# Patient Record
Sex: Male | Born: 1995 | Race: White | Hispanic: No | Marital: Married | Smoking: Current every day smoker
Health system: Southern US, Community
[De-identification: ages and names within clinical notes are randomized; demographics above are authoritative.]

## PROBLEM LIST (undated history)

## (undated) HISTORY — PX: TESTICLE SURGERY: SHX794

---
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
## 2016-06-25 ENCOUNTER — Ambulatory Visit (INDEPENDENT_AMBULATORY_CARE_PROVIDER_SITE_OTHER): Payer: 59 | Admitting: Family Medicine

## 2016-06-25 ENCOUNTER — Encounter: Payer: Self-pay | Admitting: Family Medicine

## 2016-06-25 VITALS — BP 110/70 | HR 64 | Temp 97.7°F | Wt 126.2 lb

## 2016-06-25 DIAGNOSIS — H65192 Other acute nonsuppurative otitis media, left ear: Secondary | ICD-10-CM

## 2016-06-25 MED ORDER — AMOXICILLIN 875 MG PO TABS: 875.0000 mg | ORAL_TABLET | Freq: Two times a day (BID) | ORAL | 0 refills | Status: DC

## 2016-06-25 NOTE — Patient Instructions (Signed)
You do have left ear infection - treat with amoxicillin twice daily for 7 days. May take ibuprofen 400-600mg  twice daily with meals for next 3 days. Let us know if not improving with treatment.    Otitis Media, Adult Otitis media is redness, soreness, and inflammation of the middle ear. Otitis media may be caused by allergies or, most commonly, by infection. Often it occurs as a complication of the common cold. SIGNS AND SYMPTOMS Symptoms of otitis media may include:  Earache.  Fever.  Ringing in your ear.  Headache.  Leakage of fluid from the ear. DIAGNOSIS To diagnose otitis media, your health care provider will examine your ear with an otoscope. This is an instrument that allows your health care provider to see into your ear in order to examine your eardrum. Your health care provider also will ask you questions about your symptoms. TREATMENT  Typically, otitis media resolves on its own within 3-5 days. Your health care provider may prescribe medicine to ease your symptoms of pain. If otitis media does not resolve within 5 days or is recurrent, your health care provider may prescribe antibiotic medicines if he or she suspects that a bacterial infection is the cause. HOME CARE INSTRUCTIONS   If you were prescribed an antibiotic medicine, finish it all even if you start to feel better.  Take medicines only as directed by your health care provider.  Keep all follow-up visits as directed by your health care provider. SEEK MEDICAL CARE IF:  You have otitis media only in one ear, or bleeding from your nose, or both.  You notice a lump on your neck.  You are not getting better in 3-5 days.  You feel worse instead of better. SEEK IMMEDIATE MEDICAL CARE IF:   You have pain that is not controlled with medicine.  You have swelling, redness, or pain around your ear or stiffness in your neck.  You notice that part of your face is paralyzed.  You notice that the bone behind your ear  (mastoid) is tender when you touch it. MAKE SURE YOU:   Understand these instructions.  Will watch your condition.  Will get help right away if you are not doing well or get worse.   This information is not intended to replace advice given to you by your health care provider. Make sure you discuss any questions you have with your health care provider.   Document Released: 07/18/2004 Document Revised: 11/03/2014 Document Reviewed: 05/10/2013 Elsevier Interactive Patient Education [employer]2016 Elsevier Inc.

## 2016-06-25 NOTE — Assessment & Plan Note (Signed)
Treat with 7d amox course. Reviewed ibuprofen dosing. Supportive care discussed. See pt instructions.

## 2016-06-25 NOTE — Progress Notes (Signed)
   BP 110/70   Pulse 64   Temp 97.7 F (36.5 C) (Oral)   Wt 126 lb 4 oz (57.3 kg)    CC: L earache Subjective:    Patient ID: Carlos Blake, male    DOB: [birth year], 20 y.o.   MRN: [id number]  HPI: Carlos Blake is a [age] y.o. male presenting on 06/25/2016 for Otitis Media (left)   1 wk h/o L earache. + cough and rhinorrhea present as well. Last night L headache due to ear pain. Ibuprofen did help some.  No fevers/chills, drainage, hearing loss, tinnitus.  No h/o AOM.  Grandparents sick recently.  Parents smoke.   Recent trip to [place], did swim [place].   Relevant past medical, surgical, family and social history reviewed and updated as indicated. Interim medical history since our last visit reviewed. Allergies and medications reviewed and updated. No current outpatient prescriptions on file prior to visit.   No current facility-administered medications on file prior to visit.     Review of Systems Per HPI unless specifically indicated in ROS section     Objective:    BP 110/70   Pulse 64   Temp 97.7 F (36.5 C) (Oral)   Wt 126 lb 4 oz (57.3 kg)   Wt Readings from Last 3 Encounters:  06/25/16 126 lb 4 oz (57.3 kg) (10 %, Z= -1.29)*  07/04/14 119 lb 4 oz (54.1 kg) (12 %, Z= -1.15)*  06/28/13 116 lb 8 oz (52.8 kg) (20 %, Z= -0.85)*   * Growth percentiles are based on CDC 2-20 Years data.    Physical Exam  Constitutional: He appears well-developed and well-nourished. No distress.  HENT:  Head: Normocephalic and atraumatic.  Right Ear: Hearing, tympanic membrane, external ear and ear canal normal.  Left Ear: Hearing, external ear and ear canal normal.  Nose: Nose normal. No mucosal edema or rhinorrhea. Right sinus exhibits no maxillary sinus tenderness and no frontal sinus tenderness. Left sinus exhibits no maxillary sinus tenderness and no frontal sinus tenderness.  Mouth/Throat: Uvula is midline, oropharynx is clear and moist and mucous membranes are normal. No  oropharyngeal exudate, posterior oropharyngeal edema, posterior oropharyngeal erythema or tonsillar abscesses.  Mild erythema with bulging of L TM, canal clear  Eyes: Conjunctivae and EOM are normal. Pupils are equal, round, and reactive to light. No scleral icterus.  Neck: Normal range of motion. Neck supple.  Cardiovascular: Normal rate, regular rhythm, normal heart sounds and intact distal pulses.   No murmur heard. Pulmonary/Chest: Effort normal and breath sounds normal. No respiratory distress. He has no wheezes. He has no rales.  Lymphadenopathy:    He has no cervical adenopathy.  Skin: Skin is warm and dry. No rash noted.  Nursing note and vitals reviewed.     Assessment & Plan:   Problem List Items Addressed This Visit    Acute nonsuppurative otitis media of left ear - Primary    Treat with 7d amox course. Reviewed ibuprofen dosing. Supportive care discussed. See pt instructions.      Relevant Medications   amoxicillin (AMOXIL) 875 MG tablet    Other Visit Diagnoses   None.      Follow up plan: No Follow-up on file.  Eustaquio BoydenJavier Tysin Salada, MD

## 2016-06-25 NOTE — Progress Notes (Signed)
Pre visit review using our clinic review tool, if applicable. No additional management support is needed unless otherwise documented below in the visit note. 

## 2017-11-24 ENCOUNTER — Ambulatory Visit: Payer: BLUE CROSS/BLUE SHIELD | Admitting: Family Medicine

## 2017-11-24 ENCOUNTER — Encounter: Payer: Self-pay | Admitting: Family Medicine

## 2017-11-24 VITALS — BP 118/68 | HR 110 | Temp 98.3°F | Wt 129.8 lb

## 2017-11-24 DIAGNOSIS — J111 Influenza due to unidentified influenza virus with other respiratory manifestations: Secondary | ICD-10-CM

## 2017-11-24 NOTE — Patient Instructions (Addendum)
I do think you have flu. Push fluids, rest, continue ibuprofen 800mg  with food for pain and inflammation.  Out of work until fever is gone. Symptoms should improve over next 5-6 days.   Influenza, Adult Influenza, more commonly known as "the flu," is a viral infection that primarily affects the respiratory tract. The respiratory tract includes organs that help you breathe, such as the lungs, nose, and throat. The flu causes many common cold symptoms, as well as a high fever and body aches. The flu spreads easily from person to person (is contagious). Getting a flu shot (influenza vaccination) every year is the best way to prevent influenza. What are the causes? Influenza is caused by a virus. You can catch the virus by:  Breathing in droplets from an infected person's cough or sneeze.  Touching something that was recently contaminated with the virus and then touching your mouth, nose, or eyes.  What increases the risk? The following factors may make you more likely to get the flu:  Not cleaning your hands frequently with soap and water or alcohol-based hand sanitizer.  Having close contact with many people during cold and flu season.  Touching your mouth, eyes, or nose without washing or sanitizing your hands first.  Not drinking enough fluids or not eating a healthy diet.  Not getting enough sleep or exercise.  Being under a high amount of stress.  Not getting a yearly (annual) flu shot.  You may be at a higher risk of complications from the flu, such as a severe lung infection (pneumonia), if you:  Are over the age of 21.  Are pregnant.  Have a weakened disease-fighting system (immune system). You may have a weakened immune system if you: ? Have HIV or AIDS. ? Are undergoing chemotherapy. ? Aretaking medicines that reduce the activity of (suppress) the immune system.  Have a long-term (chronic) illness, such as heart disease, kidney disease, diabetes, or lung  disease.  Have a liver disorder.  Are obese.  Have anemia.  What are the signs or symptoms? Symptoms of this condition typically last 4-10 days and may include:  Fever.  Chills.  Headache, body aches, or muscle aches.  Sore throat.  Cough.  Runny or congested nose.  Chest discomfort and cough.  Poor appetite.  Weakness or tiredness (fatigue).  Dizziness.  Nausea or vomiting.  How is this diagnosed? This condition may be diagnosed based on your medical history and a physical exam. Your health care provider may do a nose or throat swab test to confirm the diagnosis. How is this treated? If influenza is detected early, you can be treated with antiviral medicine that can reduce the length of your illness and the severity of your symptoms. This medicine may be given by mouth (orally) or through an IV tube that is inserted in one of your veins. The goal of treatment is to relieve symptoms by taking care of yourself at home. This may include taking over-the-counter medicines, drinking plenty of fluids, and adding humidity to the air in your home. In some cases, influenza goes away on its own. Severe influenza or complications from influenza may be treated in a hospital. Follow these instructions at home:  Take over-the-counter and prescription medicines only as told by your health care provider.  Use a cool mist humidifier to add humidity to the air in your home. This can make breathing easier.  Rest as needed.  Drink enough fluid to keep your urine clear or pale yellow.  Cover your mouth and nose when you cough or sneeze.  Wash your hands with soap and water often, especially after you cough or sneeze. If soap and water are not available, use hand sanitizer.  Stay home from work or school as told by your health care provider. Unless you are visiting your health care provider, try to avoid leaving home until your fever has been gone for 24 hours without the use of  medicine.  Keep all follow-up visits as told by your health care provider. This is important. How is this prevented?  Getting an annual flu shot is the best way to avoid getting the flu. You may get the flu shot in late summer, fall, or winter. Ask your health care provider when you should get your flu shot.  Wash your hands often or use hand sanitizer often.  Avoid contact with people who are sick during cold and flu season.  Eat a healthy diet, drink plenty of fluids, get enough sleep, and exercise regularly. Contact a health care provider if:  You develop new symptoms.  You have: ? Chest pain. ? Diarrhea. ? A fever.  Your cough gets worse.  You produce more mucus.  You feel nauseous or you vomit. Get help right away if:  You develop shortness of breath or difficulty breathing.  Your skin or nails turn a bluish color.  You have severe pain or stiffness in your neck.  You develop a sudden headache or sudden pain in your face or ear.  You cannot stop vomiting. This information is not intended to replace advice given to you by your health care provider. Make sure you discuss any questions you have with your health care provider. Document Released: 10/10/2000 Document Revised: 03/20/2016 Document Reviewed: 08/07/2015 Elsevier Interactive Patient Education  2017 [employer].

## 2017-11-24 NOTE — Assessment & Plan Note (Signed)
Clinical diagnosis. Did not swab due to shortage.  Supportive care reviewed with patient.  Will hold tamiflu as overall healthy, anticipate will recover well. Red flags to seek further care reviewed.

## 2017-11-24 NOTE — Progress Notes (Signed)
BP 118/68 (BP Location: Left Arm, Patient Position: Sitting, Cuff Size: Normal)   Pulse (!) 110   Temp 98.3 F (36.8 C) (Oral)   Wt 129 lb 12 oz (58.9 kg)   SpO2 95%    CC: URI? Flu? Subjective:    Patient ID: Carlos Blake, male    DOB: 05-Oct-1996, 22 y.o.   MRN: [id number]  HPI: Carlos Breachhomas M Hiser is a 22 y.o. male presenting on 11/24/2017 for URI (fever of 102 last night. cough, nausea, sinus drainage, symptoms started yesterday morning. temp this AM of 100. took ibuprofen and DayQuil. )   1d h/o fever Tmax 102, chills, nausea, sinus drainage. Progressive worsening. HA, body aches. T this morning 100. Overall dry cough.   Treating with ibuprofen and dayquil.  Did not receive flu shot this year Mom sick as well - cough.  Parents smoke, pt does.  No h/o asthma.   Relevant past medical, surgical, family and social history reviewed and updated as indicated. Interim medical history since our last visit reviewed. Allergies and medications reviewed and updated. Outpatient Medications Prior to Visit  Medication Sig Dispense Refill  . amoxicillin (AMOXIL) 875 MG tablet Take 1 tablet (875 mg total) by mouth 2 (two) times daily. 14 tablet 0   No facility-administered medications prior to visit.      Per HPI unless specifically indicated in ROS section below Review of Systems     Objective:    BP 118/68 (BP Location: Left Arm, Patient Position: Sitting, Cuff Size: Normal)   Pulse (!) 110   Temp 98.3 F (36.8 C) (Oral)   Wt 129 lb 12 oz (58.9 kg)   SpO2 95%   Wt Readings from Last 3 Encounters:  11/24/17 129 lb 12 oz (58.9 kg)  06/25/16 126 lb 4 oz (57.3 kg) (10 %, Z= -1.29)*  07/04/14 119 lb 4 oz (54.1 kg) (12 %, Z= -1.15)*   * Growth percentiles are based on CDC (Boys, 2-20 Years) data.    Physical Exam  Constitutional: He appears well-developed and well-nourished. No distress.  HENT:  Head: Normocephalic and atraumatic.  Right Ear: Hearing, tympanic membrane, external  ear and ear canal normal.  Left Ear: Hearing, tympanic membrane, external ear and ear canal normal.  Nose: Mucosal edema (nasal mucosal congestion and erythema) present. No rhinorrhea. Right sinus exhibits no maxillary sinus tenderness and no frontal sinus tenderness. Left sinus exhibits no maxillary sinus tenderness and no frontal sinus tenderness.  Mouth/Throat: Uvula is midline and mucous membranes are normal. Posterior oropharyngeal erythema present. No oropharyngeal exudate, posterior oropharyngeal edema or tonsillar abscesses.  Eyes: Conjunctivae and EOM are normal. Pupils are equal, round, and reactive to light. No scleral icterus.  Neck: Normal range of motion. Neck supple.  Cardiovascular: Normal rate, regular rhythm, normal heart sounds and intact distal pulses.  No murmur heard. Pulmonary/Chest: Effort normal and breath sounds normal. No respiratory distress. He has no wheezes. He has no rales.  Lymphadenopathy:    He has no cervical adenopathy.  Skin: Skin is warm and dry. No rash noted.  Nursing note and vitals reviewed.  Results for orders placed or performed during the hospital encounter of 06/13/13  Culture, Group A Strep  Result Value Ref Range   Specimen Description THROAT    Special Requests NONE    Culture      No Beta Hemolytic Streptococci Isolated Performed at [facility]   Report Status 06/15/2013 FINAL   POCT rapid strep A ([facility])  Result Value Ref Range   Streptococcus, Group A Screen (Direct) NEGATIVE NEGATIVE      Assessment & Plan:   Problem List Items Addressed This Visit    Influenza - Primary    Clinical diagnosis. Did not swab due to shortage.  Supportive care reviewed with patient.  Will hold tamiflu as overall healthy, anticipate will recover well. Red flags to seek further care reviewed.           Follow up plan: Return if symptoms worsen or fail to improve.  Eustaquio Boyden, MD

## 2018-11-17 ENCOUNTER — Encounter: Payer: Self-pay | Admitting: Family Medicine

## 2018-11-17 ENCOUNTER — Ambulatory Visit: Payer: BLUE CROSS/BLUE SHIELD | Admitting: Family Medicine

## 2018-11-17 VITALS — BP 122/70 | HR 78 | Temp 98.4°F | Ht 68.0 in | Wt 131.5 lb

## 2018-11-17 DIAGNOSIS — R52 Pain, unspecified: Secondary | ICD-10-CM

## 2018-11-17 LAB — POC INFLUENZA A&B (BINAX/QUICKVUE)
Influenza A, POC: NEGATIVE
Influenza B, POC: NEGATIVE

## 2018-11-17 NOTE — Patient Instructions (Signed)
Flu swab negative today. I think you may have viral illness. Push fluids and rest. Take ibuprofen 400mg  with meals for next 3-5 days.  Let us know if persistent fever >101 past 5 days, if worsening productive cough, or not improving with treatment.

## 2018-11-17 NOTE — Assessment & Plan Note (Addendum)
Overall reassuring exam.  Not consistent with influenza as no fever, respiratory symptoms. Flu swab negative. Not consistent with mono - no ST, no fever.  ?viral - supportive care reviewed. Update if new symptoms or not improving with treatment.

## 2018-11-17 NOTE — Progress Notes (Signed)
BP 122/70 (BP Location: Left Arm, Patient Position: Sitting, Cuff Size: Normal)   Pulse 78   Temp 98.4 F (36.9 C) (Oral)   Ht 5\' 8"  (1.727 m)   Wt 131 lb 8 oz (59.6 kg)   SpO2 99%   BMI 19.99 kg/m    CC: body aches Subjective:    Patient ID: Carlos Blake, male    DOB: 1996/07/25, 23 y.o.   MRN: [id number]  HPI: Carlos Blake is a 23 y.o. male presenting on 11/17/2018 for Generalized Body Aches (C/o body aches, chills and HA. Sxs last night. Took ibuprofen this morning. )   1d h/o body aches that slowly developed over the day affecting sleep, chills, headache. Mild fatigue.  Tried ibuprofen this morning.  No fever, cough, chest or head congestion, ear or tooth pain, ST, dypsnea, abd pain, nausea, vomiting, diarrhea.   No sick contacts at home.  No h/o asthma.  Parents smoke at home. Pt does not smoke.   No new medicines, supplements.      Relevant past medical, surgical, family and social history reviewed and updated as indicated. Interim medical history since our last visit reviewed. Allergies and medications reviewed and updated. No outpatient medications prior to visit.   No facility-administered medications prior to visit.      Per HPI unless specifically indicated in ROS section below Review of Systems Objective:    BP 122/70 (BP Location: Left Arm, Patient Position: Sitting, Cuff Size: Normal)   Pulse 78   Temp 98.4 F (36.9 C) (Oral)   Ht 5\' 8"  (1.727 m)   Wt 131 lb 8 oz (59.6 kg)   SpO2 99%   BMI 19.99 kg/m   Wt Readings from Last 3 Encounters:  11/17/18 131 lb 8 oz (59.6 kg)  11/24/17 129 lb 12 oz (58.9 kg)  06/25/16 126 lb 4 oz (57.3 kg) (10 %, Z= -1.29)*   * Growth percentiles are based on CDC (Boys, 2-20 Years) data.    Physical Exam Vitals signs and nursing note reviewed.  Constitutional:      General: He is not in acute distress.    Appearance: Normal appearance. He is well-developed. He is not ill-appearing.  HENT:     Head:  Normocephalic and atraumatic.     Right Ear: Hearing, tympanic membrane, ear canal and external ear normal.     Left Ear: Hearing, tympanic membrane, ear canal and external ear normal.     Nose: Nose normal. No mucosal edema, congestion or rhinorrhea.     Comments: Nasal mucosal injection    Mouth/Throat:     Mouth: Mucous membranes are moist.     Pharynx: Oropharynx is clear. Uvula midline. No oropharyngeal exudate or posterior oropharyngeal erythema.     Tonsils: No tonsillar abscesses.  Eyes:     General: No scleral icterus.    Conjunctiva/sclera: Conjunctivae normal.     Pupils: Pupils are equal, round, and reactive to light.  Neck:     Musculoskeletal: Normal range of motion and neck supple.  Cardiovascular:     Rate and Rhythm: Normal rate and regular rhythm.     Heart sounds: Normal heart sounds. No murmur.  Pulmonary:     Effort: Pulmonary effort is normal. No respiratory distress.     Breath sounds: Normal breath sounds. No wheezing or rales.  Abdominal:     General: Abdomen is flat. Bowel sounds are normal. There is no distension.     Palpations: Abdomen is soft.  There is no mass.     Tenderness: There is no abdominal tenderness. There is no guarding or rebound.     Hernia: No hernia is present.  Musculoskeletal: Normal range of motion.  Lymphadenopathy:     Cervical: No cervical adenopathy.  Skin:    General: Skin is warm and dry.     Findings: No rash.  Neurological:     Mental Status: He is alert.       Results for orders placed or performed in visit on 11/17/18  POC Influenza A&B(BINAX/QUICKVUE)  Result Value Ref Range   Influenza A, POC Negative Negative   Influenza B, POC Negative Negative   Assessment & Plan:   Problem List Items Addressed This Visit    Generalized body aches - Primary    Overall reassuring exam.  Not consistent with influenza as no fever, respiratory symptoms. Flu swab negative. Not consistent with mono - no ST, no fever.  ?viral -  supportive care reviewed. Update if new symptoms or not improving with treatment.       Relevant Orders   POC Influenza A&B(BINAX/QUICKVUE) (Completed)       No orders of the defined types were placed in this encounter.  Orders Placed This Encounter  Procedures  . POC Influenza A&B(BINAX/QUICKVUE)    Follow up plan: Return if symptoms worsen or fail to improve.  Eustaquio Boyden, MD

## 2021-02-04 ENCOUNTER — Ambulatory Visit (INDEPENDENT_AMBULATORY_CARE_PROVIDER_SITE_OTHER): Payer: BLUE CROSS/BLUE SHIELD

## 2021-02-04 ENCOUNTER — Encounter: Payer: Self-pay | Admitting: Emergency Medicine

## 2021-02-04 ENCOUNTER — Other Ambulatory Visit: Payer: Self-pay

## 2021-02-04 ENCOUNTER — Ambulatory Visit
Admission: EM | Admit: 2021-02-04 | Discharge: 2021-02-04 | Disposition: A | Discharge status: Home / Self Care | Payer: BLUE CROSS/BLUE SHIELD | Attending: Internal Medicine | Admitting: Internal Medicine

## 2021-02-04 DIAGNOSIS — S62633A Displaced fracture of distal phalanx of left middle finger, initial encounter for closed fracture: Secondary | ICD-10-CM | POA: Diagnosis not present

## 2021-02-04 DIAGNOSIS — X58XXXA Exposure to other specified factors, initial encounter: Secondary | ICD-10-CM | POA: Diagnosis not present

## 2021-02-04 DIAGNOSIS — S62633B Displaced fracture of distal phalanx of left middle finger, initial encounter for open fracture: Secondary | ICD-10-CM | POA: Diagnosis not present

## 2021-02-04 DIAGNOSIS — S6992XA Unspecified injury of left wrist, hand and finger(s), initial encounter: Secondary | ICD-10-CM

## 2021-02-04 MED ORDER — TETANUS-DIPHTH-ACELL PERTUSSIS 5-2.5-18.5 LF-MCG/0.5 IM SUSY
0.5000 mL | PREFILLED_SYRINGE | Freq: Once | INTRAMUSCULAR | Status: AC
  Administered 2021-02-04: 0.5 mL via INTRAMUSCULAR

## 2021-02-04 NOTE — Discharge Instructions (Addendum)
Keep your hand elevated and follow up with orthopedics today

## 2021-02-04 NOTE — ED Provider Notes (Signed)
[facility]    CSN: [id number] Arrival date & time: 02/04/21  1108      History   Chief Complaint Chief Complaint  Patient presents with  . Finger Injury    HPI Carlos Blake is a 25 y.o. male who presents with L middle finger pain due to mashing it between a trick and centri tank one hour ago. Now has a cut and mild tingling. Has mild pain as long as it is not touched. He does not recall when his last TD was.   History reviewed. No pertinent past medical history.  Patient Active Problem List   Diagnosis Date Noted  . Generalized body aches 11/17/2018  . Influenza 11/24/2017  . Acute nonsuppurative otitis media of left ear 06/25/2016  . 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 Well adolescent visit 06/08/2012    Past Surgical History:  Procedure Laterality Date  . audiological eval  11/07/04   ([facility]) Hearing WNL  . TESTICLE SURGERY  infant   undescended repair       Home Medications    Prior to Admission medications   Not on File    Family History Family History  Problem Relation Age of Onset  . Hypertension Father   . Cancer Maternal Aunt        breast cancer with metastases  . Heart disease Maternal Grandfather        two heart attacks  . Heart disease Paternal Grandfather        died of multiple heart attacks  . Thyroid disease Sister        hashimoto thyroiditis  . Other Sister        POTS, raynauds    Social History Social History   Tobacco Use  . Smoking status: Passive Smoke Exposure - Never Smoker  . Smokeless tobacco: Never Used  . Tobacco comment: both parents smoke  Substance Use Topics  . Alcohol use: No  . Drug use: No     Allergies   Patient has no known allergies.   Review of Systems Review of Systems  Musculoskeletal: Positive for arthralgias. Negative for gait problem.  Skin: Positive for wound.  Neurological: Positive for numbness.     Physical Exam Triage Vital Signs ED Triage Vitals [02/04/21 1126]   Enc Vitals Group     BP (!) 130/91     Pulse Rate (!) 106     Resp 18     Temp 98.9 F (37.2 C)     Temp Source Oral     SpO2 100 %     Weight      Height      Head Circumference      Peak Flow      Pain Score 4     Pain Loc      Pain Edu?      Excl. in GC?    No data found.  Updated Vital Signs BP (!) 130/91 (BP Location: Right Arm)   Pulse (!) 106   Temp 98.9 F (37.2 C) (Oral)   Resp 18   SpO2 100%   Visual Acuity Right Eye Distance:   Left Eye Distance:   Bilateral Distance:    Right Eye Near:   Left Eye Near:    Bilateral Near:     Physical Exam Vitals and nursing note reviewed.  Constitutional:      General: He is not in acute distress.    Appearance: He is normal weight. He is not toxic-appearing.  HENT:     Head: Normocephalic.     Right Ear: External ear normal.     Left Ear: External ear normal.  Eyes:     General: No scleral icterus.    Conjunctiva/sclera: Conjunctivae normal.  Cardiovascular:     Pulses: Normal pulses.  Pulmonary:     Effort: Pulmonary effort is normal.  Musculoskeletal:     Cervical back: Neck supple.  Skin:    General: Skin is warm and dry.     Capillary Refill: Capillary refill takes less than 2 seconds.     Comments: L MIDDLE FINGER- distal area has a laceration anteriorly below the mail and above the nail which is dep and had moderate bleeding on arrival, but has slowed down with bandage he has on.   Neurological:     Mental Status: He is alert and oriented to person, place, and time.     Gait: Gait normal.  Psychiatric:        Mood and Affect: Mood normal.        Behavior: Behavior normal.        Thought Content: Thought content normal.    UC Treatments / Results  Labs (all labs ordered are listed, but only abnormal results are displayed) Labs Reviewed - No data to display  EKG   Radiology DG Finger Middle Left  Result Date: 02/04/2021 CLINICAL DATA:  Left middle finger injury. EXAM: LEFT MIDDLE FINGER  2+V COMPARISON:  None. FINDINGS: Mildly displaced and comminuted fracture is seen involving the distal tuft of the third distal phalanx. No other bony abnormality is noted. Joint spaces are intact. Probable soft tissue laceration is seen in the area as well. IMPRESSION: Mildly displaced and comminuted distal tuft fracture of third distal phalanx. Electronically Signed   By: Lupita Raider M.D.   On: 02/04/2021 11:56    Procedures Procedures (including critical care time)  Medications Ordered in UC Medications  Tdap (BOOSTRIX) injection 0.5 mL (has no administration in time range)    Initial Impression / Assessment and Plan / UC Course  I have reviewed the triage vital signs and the nursing notes. Pertinent  imaging results that were available during my care of the patient were reviewed by me and considered in my medical decision making (see chart for details). Open L middle finger fracture with laceration. He was given TDAP, I reapplied the bandage he had on and sent him to [facility] for care and FU.  He will pick up a copy of the Xray on a CD at [facility] today.  Final Clinical Impressions(s) / UC Diagnoses   Final diagnoses:  Open displaced fracture of distal phalanx of left middle finger, initial encounter     Discharge Instructions     Keep your hand elevated and follow up with orthopedics today    ED Prescriptions    None     PDMP not reviewed this encounter.   Garey Ham, PA-C 02/04/21 1214

## 2021-02-04 NOTE — ED Triage Notes (Signed)
Pt states that he meshed his middle finger on the left hand in between a truck and centri tank. Pt states that the injury happened an less than an hour ago.

## 2021-02-06 DIAGNOSIS — S62633B Displaced fracture of distal phalanx of left middle finger, initial encounter for open fracture: Secondary | ICD-10-CM | POA: Diagnosis not present

## 2021-02-12 DIAGNOSIS — S62633B Displaced fracture of distal phalanx of left middle finger, initial encounter for open fracture: Secondary | ICD-10-CM | POA: Diagnosis not present

## 2021-02-19 DIAGNOSIS — S61313D Laceration without foreign body of left middle finger with damage to nail, subsequent encounter: Secondary | ICD-10-CM | POA: Diagnosis not present

## 2021-02-26 DIAGNOSIS — S62633D Displaced fracture of distal phalanx of left middle finger, subsequent encounter for fracture with routine healing: Secondary | ICD-10-CM | POA: Diagnosis not present

## 2022-12-23 ENCOUNTER — Other Ambulatory Visit: Payer: Self-pay

## 2022-12-23 ENCOUNTER — Encounter (HOSPITAL_BASED_OUTPATIENT_CLINIC_OR_DEPARTMENT_OTHER): Payer: Self-pay

## 2022-12-23 ENCOUNTER — Emergency Department (HOSPITAL_BASED_OUTPATIENT_CLINIC_OR_DEPARTMENT_OTHER): Payer: BC Managed Care – PPO | Admitting: Radiology

## 2022-12-23 ENCOUNTER — Emergency Department (HOSPITAL_BASED_OUTPATIENT_CLINIC_OR_DEPARTMENT_OTHER)
Admission: EM | Admit: 2022-12-23 | Discharge: 2022-12-23 | Disposition: A | Discharge status: Home / Self Care | Payer: BC Managed Care – PPO | Attending: Emergency Medicine | Admitting: Emergency Medicine

## 2022-12-23 DIAGNOSIS — W231XXA Caught, crushed, jammed, or pinched between stationary objects, initial encounter: Secondary | ICD-10-CM | POA: Insufficient documentation

## 2022-12-23 DIAGNOSIS — Y990 Civilian activity done for income or pay: Secondary | ICD-10-CM | POA: Insufficient documentation

## 2022-12-23 DIAGNOSIS — S62639B Displaced fracture of distal phalanx of unspecified finger, initial encounter for open fracture: Secondary | ICD-10-CM

## 2022-12-23 DIAGNOSIS — S6991XA Unspecified injury of right wrist, hand and finger(s), initial encounter: Secondary | ICD-10-CM | POA: Diagnosis present

## 2022-12-23 DIAGNOSIS — S62636A Displaced fracture of distal phalanx of right little finger, initial encounter for closed fracture: Secondary | ICD-10-CM | POA: Insufficient documentation

## 2022-12-23 DIAGNOSIS — Y9269 Other specified industrial and construction area as the place of occurrence of the external cause: Secondary | ICD-10-CM | POA: Insufficient documentation

## 2022-12-23 MED ORDER — CEPHALEXIN 500 MG PO CAPS: 500.0000 mg | ORAL_CAPSULE | Freq: Four times a day (QID) | ORAL | 0 refills | Status: AC

## 2022-12-23 MED ORDER — LIDOCAINE HCL (PF) 1 % IJ SOLN
5.0000 mL | Freq: Once | INTRAMUSCULAR | Status: DC
  Filled 2022-12-23: qty 5

## 2022-12-23 NOTE — ED Triage Notes (Signed)
Patient here POV from UC.  Endorses having crush type injury from work to Right Distal Fifth Digit. Seen at [facility] and given tetanus IM Injection.   Sent for Evaluation. 2 cm Laceration.   NAD Noted during Triage. A&Ox4. GCS 15. Ambulatory.

## 2022-12-23 NOTE — ED Notes (Signed)
RN reviewed discharge instructions with pt. Pt verbalized understanding and had no further questions. VSS upon discharge.  

## 2022-12-23 NOTE — ED Provider Notes (Signed)
[place] Provider Note   CSN: [id number] Arrival date & time: 12/23/22  1546     History  Chief Complaint  Patient presents with   Finger Injury    Carlos Blake is a 27 y.o. male with no relevant medical history.  Patient presents to ED for evaluation of crush injury to right pinky finger.  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  Patient states that earlier today a rock crushed his right pinky finger.  Patient states that finger was underneath rock for less than 1 second.  Patient states that he presented to urgent care and received tetanus update and was redirected to ED for further management.  HPI     Home Medications Prior to Admission medications   Medication Sig Start Date End Date Taking? Authorizing Provider  cephALEXin (KEFLEX) 500 MG capsule Take 1 capsule (500 mg total) by mouth 4 (four) times daily. 12/23/22  Yes Azucena Cecil, PA-C      Allergies    Patient has no known allergies.    Review of Systems   Review of Systems  Musculoskeletal:  Positive for arthralgias.    Physical Exam Updated Vital Signs BP 123/78 (BP Location: Left Arm)   Pulse 60   Temp 98.2 F (36.8 C) (Oral)   Resp 16   Ht '5\' 7"'$  (1.702 m)   Wt 63.5 kg   SpO2 100%   BMI 21.93 kg/m  Physical Exam Vitals and nursing note reviewed.  Constitutional:      General: He is not in acute distress.    Appearance: Normal appearance. He is not ill-appearing, toxic-appearing or diaphoretic.  HENT:     Head: Normocephalic and atraumatic.     Nose: Nose normal.     Mouth/Throat:     Mouth: Mucous membranes are moist.     Pharynx: Oropharynx is clear.  Eyes:     Extraocular Movements: Extraocular movements intact.     Conjunctiva/sclera: Conjunctivae normal.     Pupils: Pupils are equal, round, and reactive to light.  Cardiovascular:     Rate and Rhythm: Normal rate and regular rhythm.  Pulmonary:      Effort: Pulmonary effort is normal.     Breath sounds: Normal breath sounds. No wheezing.  Abdominal:     General: Abdomen is flat. Bowel sounds are normal.     Palpations: Abdomen is soft.     Tenderness: There is no abdominal tenderness.  Musculoskeletal:     Cervical back: Normal range of motion and neck supple. No tenderness.     Comments: 2 cm laceration to right fifth finger.  Bleeding controlled.  Patient has full flexion and extension of right fifth finger.  Doubt tendon involvement.  Neurovascularly intact.  Skin:    General: Skin is warm and dry.     Capillary Refill: Capillary refill takes less than 2 seconds.  Neurological:     Mental Status: He is alert and oriented to person, place, and time.     ED Results / Procedures / Treatments   Labs (all labs ordered are listed, but only abnormal results are displayed) Labs Reviewed - No data to display  EKG None  Radiology DG Finger Little Right  Result Date: 12/23/2022 CLINICAL DATA:  Trauma EXAM: RIGHT LITTLE FINGER 2+V COMPARISON:  None Available. FINDINGS: Fracture of the distal phalanx displaced by several mm. Associated soft tissue swelling. Osseous structures are otherwise intact. IMPRESSION: Fifth distal phalanx  fracture. Electronically Signed   By: Sammie Bench M.D.   On: 12/23/2022 16:32    Procedures .Marland KitchenLaceration Repair  Date/Time: 12/23/2022 7:04 PM  Performed by: Azucena Cecil, PA-C Authorized by: Azucena Cecil, PA-C   Consent:    Consent obtained:  Verbal   Consent given by:  Patient   Risks, benefits, and alternatives were discussed: yes     Risks discussed:  Infection, need for additional repair, nerve damage, poor wound healing, poor cosmetic result, retained foreign body, pain, tendon damage and vascular damage   Alternatives discussed:  No treatment Universal protocol:    Patient identity confirmed:  Arm band and verbally with patient Anesthesia:    Anesthesia method:  Local  infiltration   Local anesthetic:  Lidocaine 1% w/o epi Laceration details:    Location:  Finger   Finger location:  R small finger   Length (cm):  2 Pre-procedure details:    Preparation:  Imaging obtained to evaluate for foreign bodies Treatment:    Area cleansed with:  Saline   Amount of cleaning:  Extensive   Irrigation solution:  Sterile saline   Irrigation method:  Pressure wash Skin repair:    Repair method:  Sutures   Suture size:  5-0   Suture material:  Prolene   Suture technique:  Simple interrupted   Number of sutures:  3 Approximation:    Approximation:  Close Repair type:    Repair type:  Simple Post-procedure details:    Dressing:  Adhesive bandage     Medications Ordered in ED Medications  lidocaine (PF) (XYLOCAINE) 1 % injection 5 mL (has no administration in time range)    ED Course/ Medical Decision Making/ A&P  Medical Decision Making Amount and/or Complexity of Data Reviewed Radiology: ordered.  Risk Prescription drug management.   27 year old no presents ED for evaluation.  Please see HPI for further details.  On examination patient has 2 cm laceration to volar surface of right pinky finger with bleeding controlled.  Patient has full flexion and extension of right fifth finger.  Doubt tendon involvement.  Patient neurovascularly intact.  Patient reports he had his tetanus updated at urgent care prior to arrival.  Plain film imaging does show distal phalanx fracture of right fifth anger.  There is laceration involved.  This represents an open fracture which requires antibiotics.  I will also consult with hand surgery.  Patient laceration repaired per procedure note.  Patient wound was copiously irrigated with normal saline prior to laceration repair.  Patient we placed on Keflex and advised to follow-up with hand surgery.  Case discussed with Dr. Ala Bent who will see the patient in the office in 1 week.  Patient right for finger placed in  static splint in extension.  Patient provided with materials to care for wound at home.  Antibiotics sent in.  Tetanus updated.  Patient will see hand surgery in 1 week.  Patient given return precautions and he voiced understanding.  Patient had all his questions answered to his satisfaction.  Patient stable for discharge.   Final Clinical Impression(s) / ED Diagnoses Final diagnoses:  Displaced fracture of distal phalanx of unspecified finger, initial encounter for open fracture    Rx / DC Orders ED Discharge Orders          Ordered    cephALEXin (KEFLEX) 500 MG capsule  4 times daily        12/23/22 1911  Azucena Cecil, PA-C 12/23/22 1912    Lorelle Gibbs, DO 12/24/22 0001

## 2022-12-23 NOTE — Discharge Instructions (Addendum)
Please return to the ED with any new or worsening signs or symptoms Please [facility] Please read attached guide concerning finger fractures Please begin taking Keflex 4 times daily for the next 7 days Please remain in finger splint until seen by hand surgery
